# Patient Record
Sex: Male | Born: 1960 | Race: White | Hispanic: No | Marital: Married | Smoking: Current every day smoker
Health system: Southern US, Community
[De-identification: ages and names within clinical notes are randomized; demographics above are authoritative.]

## PROBLEM LIST (undated history)

## (undated) DIAGNOSIS — M1990 Unspecified osteoarthritis, unspecified site: Secondary | ICD-10-CM

## (undated) HISTORY — DX: Unspecified osteoarthritis, unspecified site: M19.90

---

## 2001-11-24 ENCOUNTER — Emergency Department (HOSPITAL_COMMUNITY): Admission: EM | Admit: 2001-11-24 | Discharge: 2001-11-24 | Payer: Self-pay | Admitting: Emergency Medicine

## 2003-11-18 ENCOUNTER — Emergency Department (HOSPITAL_COMMUNITY): Admission: EM | Admit: 2003-11-18 | Discharge: 2003-11-18 | Payer: Self-pay | Admitting: Emergency Medicine

## 2003-12-10 ENCOUNTER — Ambulatory Visit (HOSPITAL_COMMUNITY): Admission: AD | Admit: 2003-12-10 | Discharge: 2003-12-10 | Payer: Self-pay | Admitting: Urology

## 2005-03-10 IMAGING — CT CT PELVIS W/O CM
1 series · 15 of 32 positions shown, 19 images · non-contrast
Comparison: none

CLINICAL DATA: Back pain. 
CT OF ABDOMEN WITHOUT CONTRAST, 11/18/03
No prior studies.
TECHNIQUE: Contiguous axial CT images were obtained from the adrenal glands to the iliac crest.
TECHNIQUE: Contiguous axial CT images were obtained from the iliac crest to the proximal femurs.

[Series 2: renal stone · axial · 0.70mm/px · z∈[-482,-152]mm · 15 of 74 slices shown, 19 images]
[im 5/74  soft-tissue]
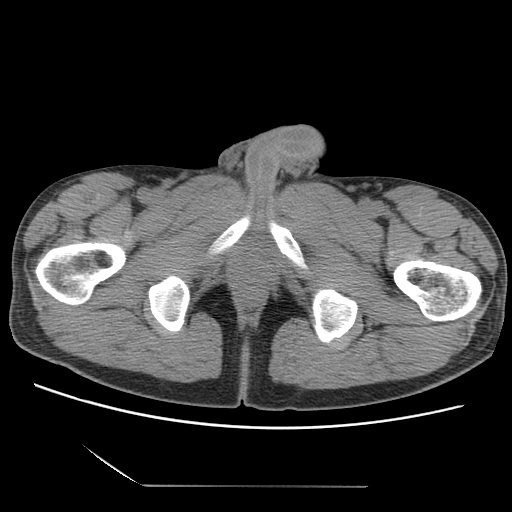
[im 5/74  bone]
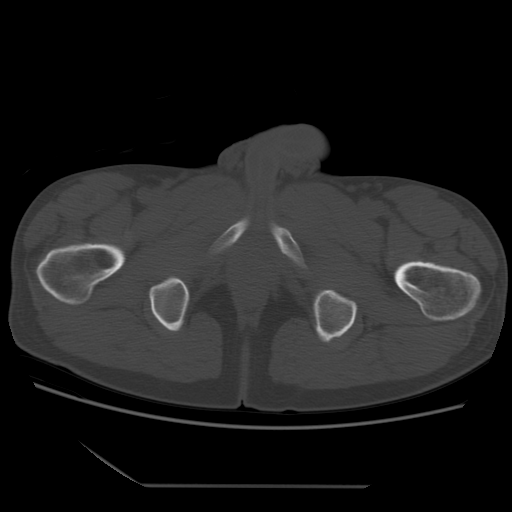
[im 10/74  soft-tissue]
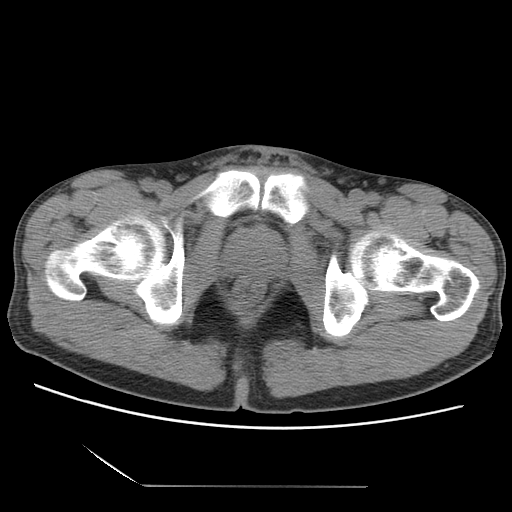
[im 15/74  soft-tissue]
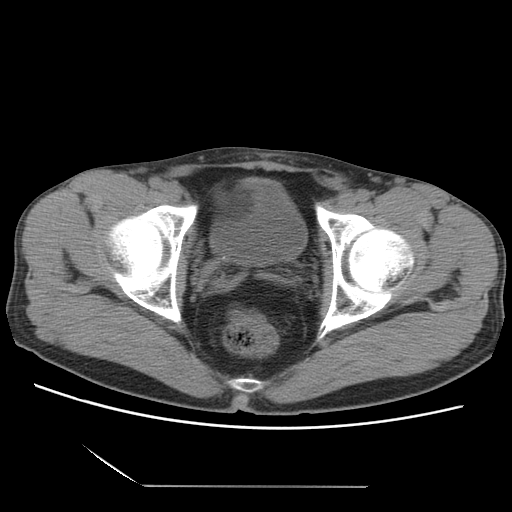
[im 22/74  soft-tissue]
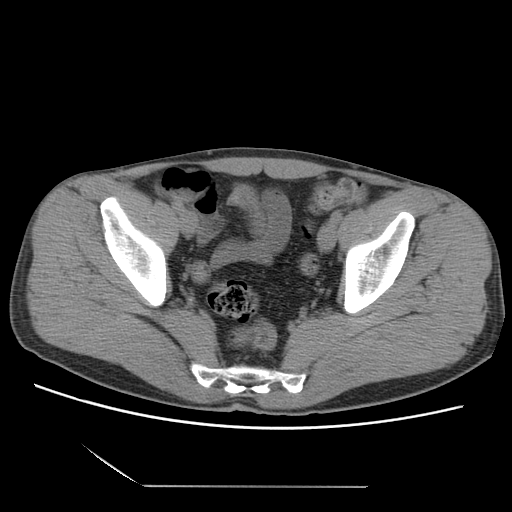
[im 26/74  soft-tissue]
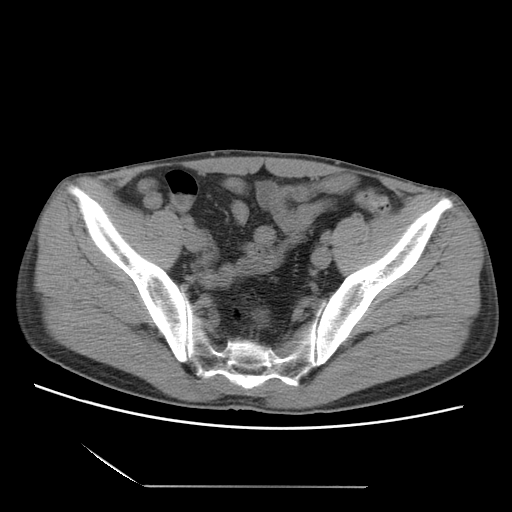
[im 31/74  soft-tissue]
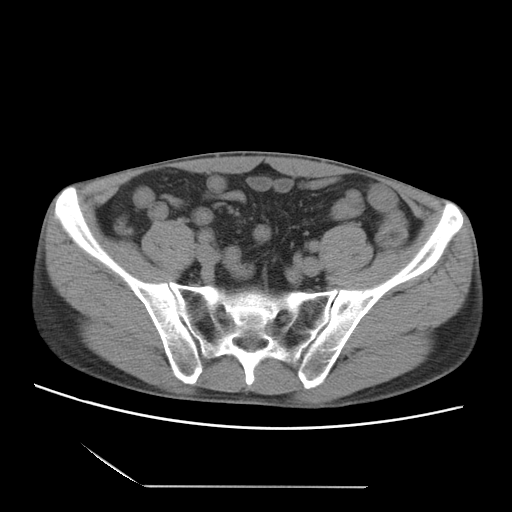
[im 38/74  soft-tissue]
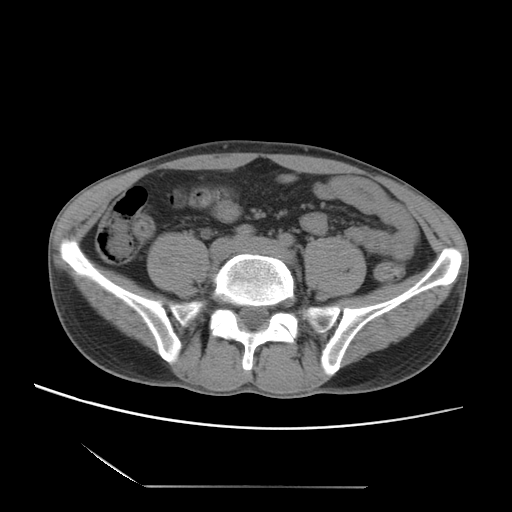
[im 43/74  soft-tissue]
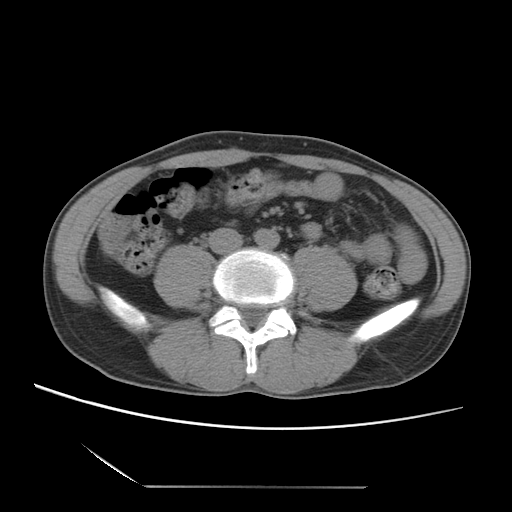
[im 48/74  soft-tissue]
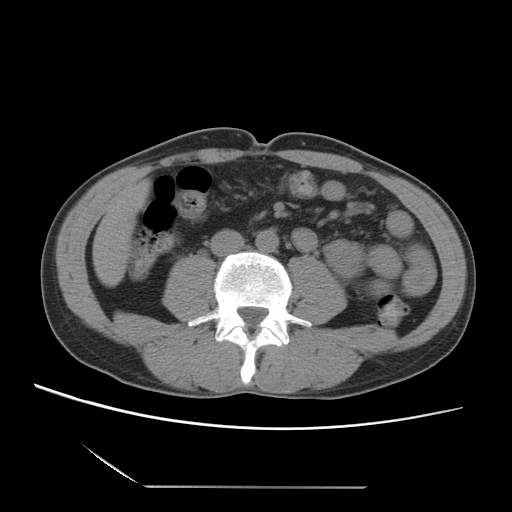
[im 48/74  bone]
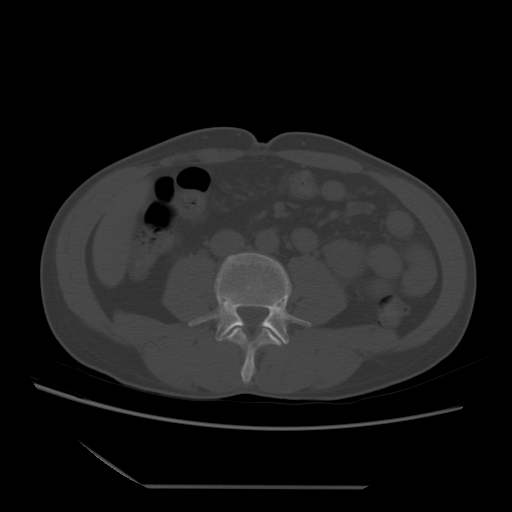
[im 52/74  soft-tissue]
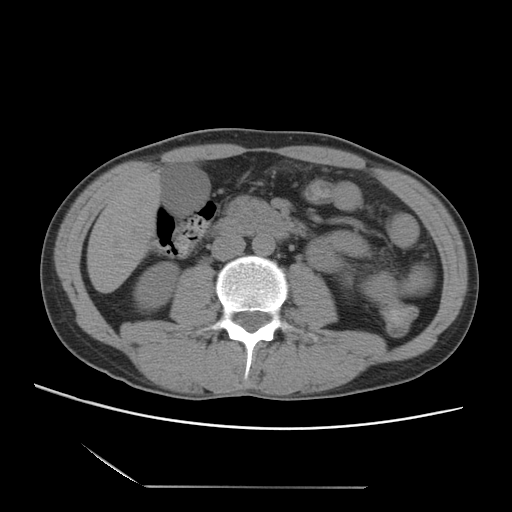
[im 59/74  soft-tissue]
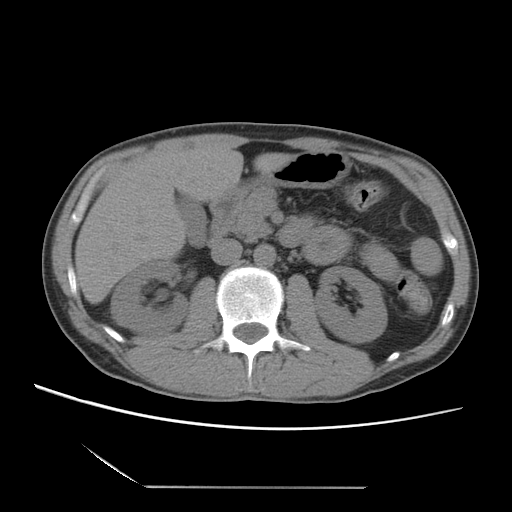
[im 64/74  soft-tissue]
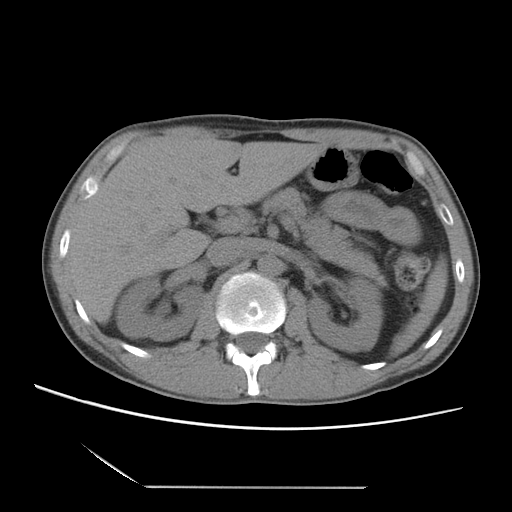
[im 64/74  lung]
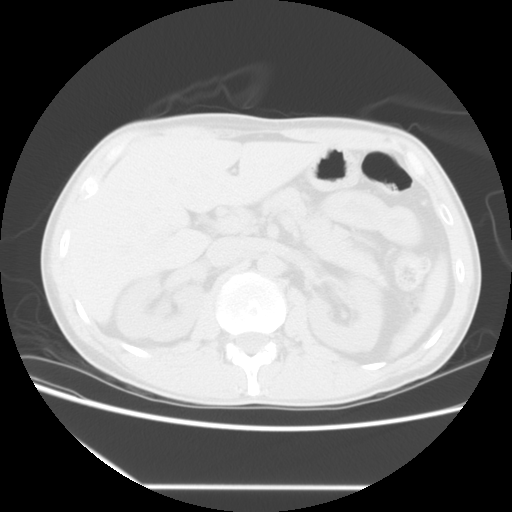
[im 66/74  lung]
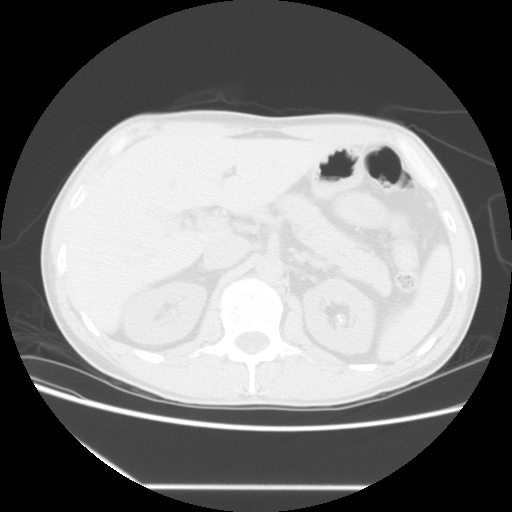
[im 69/74  soft-tissue]
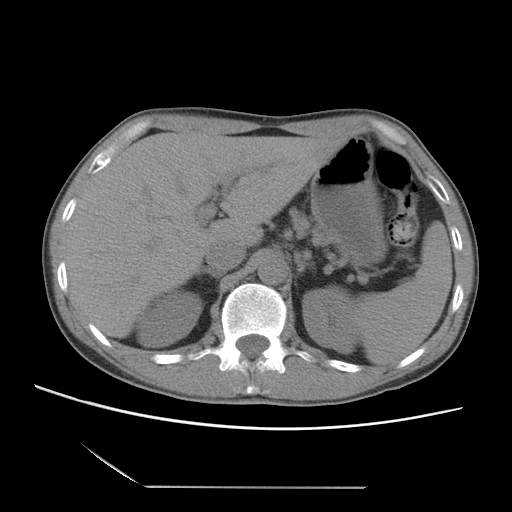
[im 69/74  lung]
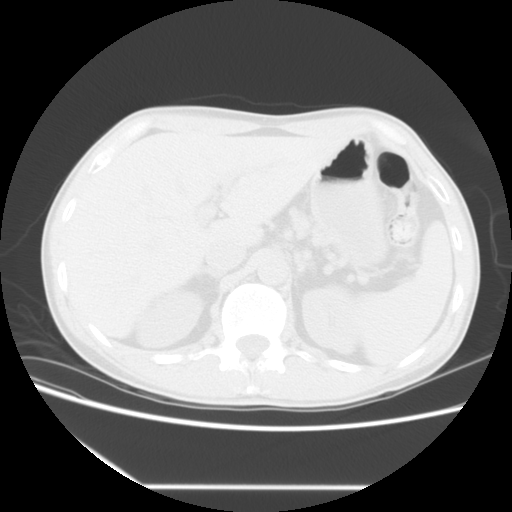
[im 71/74  lung]
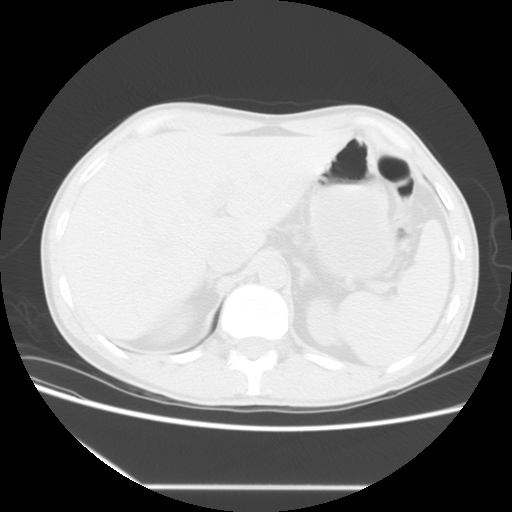

[15 of 32 positions shown; findings below may reference images not displayed]

FINDINGS: There is right hydronephrosis and hydroureter extending down to the level of a 2-mm right ureterovesical junction calculus.  There is some left nephrolithiasis with a 3-mm and separate 2-mm upper pole calculus on the left.    No left obstruction is identified. 
Noncontrast CT appearance of the liver, pancreas, spleen, adrenal glands, and gallbladder is unremarkable.  No pathologic or retroperitoneal adenopathy.   There is equivocal wall thickening of the jejunum.  This would be better evaluated with small bowel follow through examination or examination which includes oral and IV contrast.
IMPRESSION: Right hydronephrosis and hydroureter extending down to the level of an obstructed 2-mm right UVJ calculus. 
Left nephrolithiasis. 
Cannot exclude jejunal wall thickening. 
CT OF PELVIS
FINDINGS: There is right hydroureter extending down to the level of an obstructed 2-mm right UVJ calculus.  Tubular structure believed to represent the appendix appears normal.  No left distal ureteral calculus is evident.   Several small left pelvic phleboliths are noted.  No pelvic adenopathy or free pelvic fluid.
IMPRESSION: Obstructive 2-mm right UVJ calculus.

## 2015-03-07 ENCOUNTER — Ambulatory Visit (INDEPENDENT_AMBULATORY_CARE_PROVIDER_SITE_OTHER): Payer: Managed Care, Other (non HMO) | Admitting: Urgent Care

## 2015-03-07 VITALS — BP 126/80 | HR 65 | Temp 98.1°F | Resp 18 | Ht 69.5 in | Wt 149.4 lb

## 2015-03-07 DIAGNOSIS — Z87442 Personal history of urinary calculi: Secondary | ICD-10-CM | POA: Diagnosis not present

## 2015-03-07 DIAGNOSIS — R109 Unspecified abdominal pain: Secondary | ICD-10-CM | POA: Diagnosis not present

## 2015-03-07 LAB — POCT URINALYSIS DIP (MANUAL ENTRY)
BILIRUBIN UA: NEGATIVE
BILIRUBIN UA: NEGATIVE
GLUCOSE UA: NEGATIVE
Leukocytes, UA: NEGATIVE
Nitrite, UA: NEGATIVE
Protein Ur, POC: NEGATIVE
RBC UA: NEGATIVE
SPEC GRAV UA: 1.02
Urobilinogen, UA: 0.2
pH, UA: 6

## 2015-03-07 LAB — POC MICROSCOPIC URINALYSIS (UMFC)

## 2015-03-07 MED ORDER — HYDROCODONE-ACETAMINOPHEN 5-325 MG PO TABS: 1.0000 | ORAL_TABLET | Freq: Four times a day (QID) | ORAL | Status: AC | PRN

## 2015-03-07 MED ORDER — METHOCARBAMOL 750 MG PO TABS: 750.0000 mg | ORAL_TABLET | Freq: Three times a day (TID) | ORAL | Status: AC | PRN

## 2015-03-07 MED ORDER — KETOROLAC TROMETHAMINE 60 MG/2ML IM SOLN
60.0000 mg | Freq: Once | INTRAMUSCULAR | Status: AC
  Administered 2015-03-07: 60 mg via INTRAMUSCULAR

## 2015-03-07 NOTE — Patient Instructions (Signed)
Renal Colic  Renal colic is pain that is caused by passing a kidney stone. The pain can be sharp and severe. It may be felt in the back, abdomen, side (flank), or groin. It can cause nausea. Renal colic can come and go.  HOME CARE INSTRUCTIONS  Watch your condition for any changes. The following actions may help to lessen any discomfort that you are feeling:  · Take medicines only as directed by your health care provider.  · Ask your health care provider if it is okay to take over-the-counter pain medicine.  · Drink enough fluid to keep your urine clear or pale yellow. Drink 6-8 glasses of water each day.  · Limit the amount of salt that you eat to less than 2 grams per day.  · Reduce the amount of protein in your diet. Eat less meat, fish, nuts, and dairy.  · Avoid foods such as spinach, rhubarb, nuts, or bran. These may make kidney stones more likely to form.  SEEK MEDICAL CARE IF:  · You have a fever or chills.  · Your urine smells bad or looks cloudy.  · You have pain or burning when you pass urine.  SEEK IMMEDIATE MEDICAL CARE IF:  · Your flank pain or groin pain suddenly worsens.  · You become confused or disoriented or you lose consciousness.     This information is not intended to replace advice given to you by your health care provider. Make sure you discuss any questions you have with your health care provider.     Document Released: 12/01/2004 Document Revised: 03/14/2014 Document Reviewed: 01/01/2014  Elsevier Interactive Patient Education ©2016 [employer]

## 2015-03-07 NOTE — Progress Notes (Signed)
MRN: [id number] DOB: [birth year]  Subjective:   Pedro Kelly is a 54 y.o. male presenting for chief complaint of Nephrolithiasis  Reports 4 day history of right sided severe back/flank pain. Patient has difficulty moving, sitting, walking. Has also had nausea. Has tried Tamsulosin and Norco from his previous episode of renal stones, had renal stone 06/2013, this episode feels the same as the last. Denies fever, abdominal pain, vomiting, pelvic pain, dysuria, hematuria, cloudy urine, loose stools, constipations. Denies smoking cigarettes or drinking alcohol.  Pedro Kelly currently has no medications in their medication list. Also has No Known Allergies.  Pedro Kelly  has a past medical history of Arthritis. Also  has no past surgical history on file.   His family history includes Diabetes in his father; Heart disease in his father; Hypertension in his father and mother.  Objective:   Vitals: BP 126/80 mmHg  Pulse 65  Temp(Src) 98.1 F (36.7 C) (Oral)  Resp 18  Ht 5' 9.5" (1.765 m)  Wt 149 lb 6.4 oz (67.767 kg)  BMI 21.75 kg/m2  SpO2 95%  Physical Exam  Constitutional: He is oriented to person, place, and time. He appears well-developed and well-nourished.  HENT:  Mouth/Throat: Oropharynx is clear and moist.  Eyes: No scleral icterus.  Cardiovascular: Normal rate, regular rhythm and intact distal pulses.  Exam reveals no gallop and no friction rub.   No murmur heard. Pulmonary/Chest: No respiratory distress. He has no wheezes. He has no rales.  Abdominal: Soft. Bowel sounds are normal. He exhibits no distension and no mass. There is no tenderness.  Musculoskeletal:       Lumbar back: He exhibits decreased range of motion (bending, extending), tenderness (over area outlined) and spasm (bilateral over paraspinal muscles). He exhibits no swelling, no edema, no deformity and no laceration.       Back:  Neurological: He is alert and oriented to person, place, and time.  Skin: Skin is  warm and dry. No rash noted. No erythema. No pallor.    Results for orders placed or performed in visit on 03/07/15 (from the past 24 hour(s))  POCT urinalysis dipstick     Status: None   Collection Time: 03/07/15  3:03 PM  Result Value Ref Range   Color, UA yellow yellow   Clarity, UA clear clear   Glucose, UA negative negative   Bilirubin, UA negative negative   Ketones, POC UA negative negative   Spec Grav, UA 1.020    Blood, UA negative negative   pH, UA 6.0    Protein Ur, POC negative negative   Urobilinogen, UA 0.2    Nitrite, UA Negative Negative   Leukocytes, UA Negative Negative  POCT Microscopic Urinalysis ([facility])     Status: Abnormal   Collection Time: 03/07/15  3:03 PM  Result Value Ref Range   WBC,UR,HPF,POC None None WBC/hpf   RBC,UR,HPF,POC None None RBC/hpf   Bacteria None None, Too numerous to count   Mucus Present (A) Absent   Epithelial Cells, UR Per Microscopy Few (A) None, Too numerous to count cells/hpf   Assessment and Plan :   1. History of renal stone 2. Right flank pain - Patient insisted on toradol injection for pain. I counseled patient on potential for adverse effects with respect to his kidneys. Patient verbalized understanding and insisted that the only reason he came was to get an injection. I counseled on diagnosis of renal stone, he is to continue tamsulosin, aggressive hydration, hydrocodone for pain, strain urine  and rtc if he passes and collects stone. Patient verbalized understanding.  Wallis BambergMario Tarsha Blando, PA-C [facility] [phone] 03/07/2015 2:52 PM

## 2015-03-08 ENCOUNTER — Encounter: Payer: Self-pay | Admitting: Urgent Care

## 2015-03-08 LAB — COMPREHENSIVE METABOLIC PANEL
ALT: 15 U/L (ref 9–46)
AST: 15 U/L (ref 10–35)
Albumin: 4.6 g/dL (ref 3.6–5.1)
Alkaline Phosphatase: 64 U/L (ref 40–115)
BUN: 15 mg/dL (ref 7–25)
CHLORIDE: 100 mmol/L (ref 98–110)
CO2: 33 mmol/L — AB (ref 20–31)
CREATININE: 0.91 mg/dL (ref 0.70–1.33)
Calcium: 10.2 mg/dL (ref 8.6–10.3)
Glucose, Bld: 98 mg/dL (ref 65–99)
POTASSIUM: 4.8 mmol/L (ref 3.5–5.3)
SODIUM: 139 mmol/L (ref 135–146)
Total Bilirubin: 0.7 mg/dL (ref 0.2–1.2)
Total Protein: 7.2 g/dL (ref 6.1–8.1)

## 2018-01-08 ENCOUNTER — Other Ambulatory Visit (HOSPITAL_COMMUNITY): Payer: Self-pay | Admitting: Orthopedic Surgery

## 2018-01-08 DIAGNOSIS — M7989 Other specified soft tissue disorders: Principal | ICD-10-CM

## 2018-01-08 DIAGNOSIS — M79662 Pain in left lower leg: Secondary | ICD-10-CM

## 2018-01-09 ENCOUNTER — Ambulatory Visit (HOSPITAL_COMMUNITY)
Admission: RE | Admit: 2018-01-09 | Discharge: 2018-01-09 | Disposition: A | Discharge status: Home / Self Care | Payer: Managed Care, Other (non HMO) | Source: Ambulatory Visit | Attending: Internal Medicine | Admitting: Internal Medicine

## 2018-01-09 DIAGNOSIS — M79662 Pain in left lower leg: Secondary | ICD-10-CM | POA: Diagnosis present

## 2018-01-09 DIAGNOSIS — M7989 Other specified soft tissue disorders: Secondary | ICD-10-CM | POA: Insufficient documentation

## 2018-08-14 ENCOUNTER — Ambulatory Visit: Payer: Managed Care, Other (non HMO) | Admitting: Orthopaedic Surgery

## 2018-08-21 ENCOUNTER — Ambulatory Visit (INDEPENDENT_AMBULATORY_CARE_PROVIDER_SITE_OTHER): Payer: No Typology Code available for payment source | Admitting: Orthopaedic Surgery

## 2018-08-21 ENCOUNTER — Encounter: Payer: Self-pay | Admitting: Orthopaedic Surgery

## 2018-08-21 ENCOUNTER — Other Ambulatory Visit: Payer: Self-pay

## 2018-08-21 ENCOUNTER — Ambulatory Visit (INDEPENDENT_AMBULATORY_CARE_PROVIDER_SITE_OTHER): Payer: No Typology Code available for payment source

## 2018-08-21 DIAGNOSIS — M79645 Pain in left finger(s): Secondary | ICD-10-CM

## 2018-08-21 NOTE — Progress Notes (Signed)
   Office Visit Note   Patient: Pedro Kelly           Date of Birth: 17-Nov-1960           MRN: [id number] Visit Date: 08/21/2018              Requested by: No referring provider defined for this encounter. PCP: Patient, No Pcp Per   Assessment & Plan: Visit Diagnoses:  1. Pain in left finger(s)     Plan: Impression is left ring bony mallet finger.  We will place patient in a stack splint and make an urgent referral to Dr. Leanora Cover for further evaluation and treatment recommendation.  He will follow-up with [place] as needed.  Follow-Up Instructions: Return if symptoms worsen or fail to improve.   Orders:  Orders Placed This Encounter  Procedures  . XR Finger Ring Left  . Ambulatory referral to Hand Surgery   No orders of the defined types were placed in this encounter.     Procedures: No procedures performed   Clinical Data: No additional findings.   Subjective: Chief Complaint  Patient presents with  . Hand Pain    left ring finger injury    HPI patient is a pleasant right-hand-dominant gentleman who presents our clinic today with an injury to his left finger.  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  He was seen in urgent care setting where he was placed in an aluminum form splint.  He was told to follow-up with [place].  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  He has had continued pain to the left ring finger, although this has improved over the past several days.  The pain he has is primarily with flexing the finger.  He denies any numbness, tingling or burning.  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  He has stopped wearing his splint.    Review of Systems as detailed in HPI.  All others reviewed and are negative.   Objective: Vital Signs: There were no vitals taken for this visit.  Physical Exam well-developed well-nourished  gentleman in no acute distress.  Alert and oriented x3.  Ortho Exam examination of the left ring finger shows no deformity to the DIP joint.  He has mild swelling and erythema.  Minimal tenderness.  Specialty Comments:  No specialty comments available.  Imaging: Xr Finger Ring Left  Result Date: 08/21/2018 X-rays demonstrate a bony mallet finger with large dorsal fracture fragment.  No dislocation of DIP joint.    PMFS History: There are no active problems to display for this patient.  Past Medical History:  Diagnosis Date  . Arthritis     Family History  Problem Relation Age of Onset  . Hypertension Mother   . Hypertension Father   . Heart disease Father   . Diabetes Father     History reviewed. No pertinent surgical history. Social History   Occupational History  . Not on file  Tobacco Use  . Smoking status: Current Every Day Smoker  Substance and Sexual Activity  . Alcohol use: No    Alcohol/week: 0.0 standard drinks  . Drug use: No  . Sexual activity: Not on file

## 2018-08-22 ENCOUNTER — Telehealth: Payer: Self-pay

## 2018-08-22 NOTE — Telephone Encounter (Signed)
Work comp adj needs work status addressed from [employer].

## 2018-08-22 NOTE — Telephone Encounter (Signed)
Emailed work note to [employer] adj [employer] ([email or web address])

## 2018-08-22 NOTE — Telephone Encounter (Signed)
Out of work until he see [employer]

## 2018-08-24 ENCOUNTER — Telehealth: Payer: Self-pay | Admitting: Orthopaedic Surgery

## 2018-08-24 ENCOUNTER — Telehealth: Payer: Self-pay

## 2018-08-24 NOTE — Telephone Encounter (Signed)
See message below °

## 2018-08-24 NOTE — Telephone Encounter (Signed)
yes

## 2018-08-24 NOTE — Telephone Encounter (Signed)
Patient called advised he has been working for the past 2 weeks and work comp is asking for a letter stating patient can be released for full duty. The number to contact patient is [phone]

## 2018-08-24 NOTE — Telephone Encounter (Signed)
Work note made for patient.

## 2018-08-24 NOTE — Telephone Encounter (Signed)
Called patient to advise. He states he would like this faxed to [employer] fax number [phone].   Faxed work note.   He states he did not know he was suppose to be [place] previously. But does need this note for work.

## 2018-08-24 NOTE — Telephone Encounter (Signed)
See other message

## 2018-08-27 ENCOUNTER — Telehealth: Payer: Self-pay

## 2018-08-27 NOTE — Telephone Encounter (Signed)
Faxed the updated work note from 08/24/18 to the adj per her request

## 2018-10-12 ENCOUNTER — Other Ambulatory Visit: Payer: Self-pay

## 2018-10-12 DIAGNOSIS — Z20822 Contact with and (suspected) exposure to covid-19: Secondary | ICD-10-CM

## 2018-10-13 LAB — NOVEL CORONAVIRUS, NAA: SARS-CoV-2, NAA: DETECTED — AB

## 2018-10-19 ENCOUNTER — Other Ambulatory Visit: Payer: Self-pay

## 2018-10-19 DIAGNOSIS — Z20822 Contact with and (suspected) exposure to covid-19: Secondary | ICD-10-CM

## 2018-10-21 LAB — NOVEL CORONAVIRUS, NAA: SARS-CoV-2, NAA: NOT DETECTED

## 2019-01-29 ENCOUNTER — Other Ambulatory Visit: Payer: Self-pay

## 2019-01-29 DIAGNOSIS — Z20822 Contact with and (suspected) exposure to covid-19: Secondary | ICD-10-CM

## 2019-01-31 LAB — NOVEL CORONAVIRUS, NAA: SARS-CoV-2, NAA: NOT DETECTED

## 2023-06-17 ENCOUNTER — Emergency Department
Admission: EM | Admit: 2023-06-17 | Discharge: 2023-06-17 | Disposition: A | Discharge status: Home / Self Care | Payer: Self-pay | Attending: Emergency Medicine | Admitting: Emergency Medicine

## 2023-06-17 DIAGNOSIS — W268XXA Contact with other sharp object(s), not elsewhere classified, initial encounter: Secondary | ICD-10-CM | POA: Insufficient documentation

## 2023-06-17 DIAGNOSIS — Z23 Encounter for immunization: Secondary | ICD-10-CM | POA: Insufficient documentation

## 2023-06-17 DIAGNOSIS — S0121XA Laceration without foreign body of nose, initial encounter: Secondary | ICD-10-CM | POA: Insufficient documentation

## 2023-06-17 DIAGNOSIS — Y9389 Activity, other specified: Secondary | ICD-10-CM | POA: Insufficient documentation

## 2023-06-17 MED ORDER — IBUPROFEN 800 MG PO TABS
800.0000 mg | ORAL_TABLET | Freq: Once | ORAL | Status: AC
  Administered 2023-06-17: 800 mg via ORAL
  Filled 2023-06-17: qty 1

## 2023-06-17 MED ORDER — TETANUS-DIPHTH-ACELL PERTUSSIS 5-2.5-18.5 LF-MCG/0.5 IM SUSY
0.5000 mL | PREFILLED_SYRINGE | Freq: Once | INTRAMUSCULAR | Status: AC
  Administered 2023-06-17: 0.5 mL via INTRAMUSCULAR
  Filled 2023-06-17: qty 0.5

## 2023-06-17 MED ADMIN — Lidocaine HCl Local Preservative Free (PF) Inj 1%: 5 mL | NDC 63323049209

## 2023-06-17 MED FILL — Lidocaine HCl Local Preservative Free (PF) Inj 1%: 5.0000 mL | INTRAMUSCULAR | Qty: 5 | Status: AC
